# Patient Record
Sex: Male | Born: 1995 | Race: Black or African American | Hispanic: No | Marital: Single | Smoking: Current some day smoker
Health system: Southern US, Community
[De-identification: ages and names within clinical notes are randomized; demographics above are authoritative.]

---
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
## 2014-03-23 ENCOUNTER — Encounter (HOSPITAL_COMMUNITY): Payer: Self-pay | Admitting: Emergency Medicine

## 2014-03-23 ENCOUNTER — Emergency Department (HOSPITAL_COMMUNITY)
Admission: EM | Admit: 2014-03-23 | Discharge: 2014-03-23 | Disposition: A | Discharge status: Home / Self Care | Payer: Medicaid Other | Attending: Emergency Medicine | Admitting: Emergency Medicine

## 2014-03-23 DIAGNOSIS — Z791 Long term (current) use of non-steroidal anti-inflammatories (NSAID): Secondary | ICD-10-CM | POA: Insufficient documentation

## 2014-03-23 DIAGNOSIS — J029 Acute pharyngitis, unspecified: Secondary | ICD-10-CM

## 2014-03-23 DIAGNOSIS — Z79899 Other long term (current) drug therapy: Secondary | ICD-10-CM | POA: Insufficient documentation

## 2014-03-23 DIAGNOSIS — Z87891 Personal history of nicotine dependence: Secondary | ICD-10-CM | POA: Insufficient documentation

## 2014-03-23 MED ORDER — HYDROCODONE-ACETAMINOPHEN 5-325 MG PO TABS: 1.0000 | ORAL_TABLET | ORAL | Status: DC | PRN

## 2014-03-23 MED ORDER — IBUPROFEN 800 MG PO TABS: 800.0000 mg | ORAL_TABLET | Freq: Three times a day (TID) | ORAL | Status: DC

## 2014-03-23 MED ORDER — KETOROLAC TROMETHAMINE 10 MG PO TABS
10.0000 mg | ORAL_TABLET | Freq: Once | ORAL | Status: AC
  Administered 2014-03-23: 10 mg via ORAL
  Filled 2014-03-23: qty 1

## 2014-03-23 MED ORDER — PENICILLIN G BENZATHINE 1200000 UNIT/2ML IM SUSP
1.2000 10*6.[IU] | Freq: Once | INTRAMUSCULAR | Status: AC
  Administered 2014-03-23: 1.2 10*6.[IU] via INTRAMUSCULAR
  Filled 2014-03-23: qty 2

## 2014-03-23 NOTE — Discharge Instructions (Signed)
You were treated in the emergency department with an injection of Bicillin. Please use ibuprofen 3 times daily with food over the next 5 days. May use Norco for headache or more severe aching if needed. This medication may cause drowsiness, please do not drink alcohol, drive, operate machines, orbits is patent activities that require concentration while taking this medication. Pharyngitis Pharyngitis is a sore throat (pharynx). There is redness, pain, and swelling of your throat. HOME CARE   Drink enough fluids to keep your pee (urine) clear or pale yellow.  Only take medicine as told by your doctor.  You may get sick again if you do not take medicine as told. Finish your medicines, even if you start to feel better.  Do not take aspirin.  Rest.  Rinse your mouth (gargle) with salt water ( tsp of salt per 1 qt of water) every 1-2 hours. This will help the pain.  If you are not at risk for choking, you can suck on hard candy or sore throat lozenges. GET HELP IF:  You have large, tender lumps on your neck.  You have a rash.  You cough up green, yellow-brown, or bloody spit. GET HELP RIGHT AWAY IF:   You have a stiff neck.  You drool or cannot swallow liquids.  You throw up (vomit) or are not able to keep medicine or liquids down.  You have very bad pain that does not go away with medicine.  You have problems breathing (not from a stuffy nose). MAKE SURE YOU:   Understand these instructions.  Will watch your condition.  Will get help right away if you are not doing well or get worse. Document Released: 07/01/2007 Document Revised: 11/02/2012 Document Reviewed: 09/19/2012 [facility] Patient Information 2015 [employer], [place]. This information is not intended to replace advice given to you by your health care provider. Make sure you discuss any questions you have with your health care provider.

## 2014-03-23 NOTE — ED Notes (Signed)
Sore throat, with exudate on tonsils.  Alert, mouth dry.  NAD

## 2014-03-23 NOTE — ED Notes (Signed)
Pt reports sore throat x 2 days

## 2014-03-23 NOTE — ED Provider Notes (Signed)
CSN: [id number]     Arrival date & time 03/23/14  1106 History   First MD Initiated Contact with Patient 03/23/14 1140     Chief Complaint  Patient presents with  . Sore Throat     (Consider location/radiation/quality/duration/timing/severity/associated sxs/prior Treatment) Patient is a 19 y.o. male presenting with pharyngitis. The history is provided by the patient.  Sore Throat This is a new problem. The current episode started in the past 7 days. The problem occurs intermittently. The problem has been gradually worsening. Associated symptoms include chills and a sore throat. Pertinent negatives include no abdominal pain, congestion, coughing, nausea, rash or vomiting. The symptoms are aggravated by swallowing. He has tried nothing for the symptoms. The treatment provided no relief.    History reviewed. No pertinent past medical history. History reviewed. No pertinent past surgical history. History reviewed. No pertinent family history. History  Substance Use Topics  . Smoking status: Former Games developermoker  . Smokeless tobacco: Not on file  . Alcohol Use: No    Review of Systems  Constitutional: Positive for chills.  HENT: Positive for sore throat. Negative for congestion.   Respiratory: Negative for cough.   Gastrointestinal: Negative for nausea, vomiting and abdominal pain.  Skin: Negative for rash.  All other systems reviewed and are negative.     Allergies  Review of patient's allergies indicates no known allergies.  Home Medications   Prior to Admission medications   Medication Sig Start Date End Date Taking? Authorizing Provider  cyclobenzaprine (FLEXERIL) 10 MG tablet Take 1 tablet (10 mg total) by mouth 2 (two) times daily as needed for muscle spasms. 03/22/12   Glynn OctaveStephen Rancour, MD  HYDROcodone-acetaminophen (NORCO/VICODIN) 5-325 MG per tablet Take 1 tablet by mouth every 4 (four) hours as needed. 04/24/12   Hope Orlene OchM Neese, NP  ibuprofen (ADVIL,MOTRIN) 800 MG tablet Take  800 mg by mouth 3 (three) times daily. 03/22/12   Glynn OctaveStephen Rancour, MD   BP 125/62 mmHg  Pulse 78  Temp(Src) 98.1 F (36.7 C) (Oral)  Resp 18  Ht 5\' 10"  (1.778 m)  Wt 209 lb (94.802 kg)  BMI 29.99 kg/m2  SpO2 100% Physical Exam  Constitutional: He is oriented to person, place, and time. He appears well-developed and well-nourished.  Non-toxic appearance.  HENT:  Head: Normocephalic.  Right Ear: Tympanic membrane and external ear normal.  Left Ear: Tympanic membrane and external ear normal.  Mouth/Throat: Uvula swelling present. Posterior oropharyngeal erythema and tonsillar abscesses present.  Eyes: EOM and lids are normal. Pupils are equal, round, and reactive to light.  Neck: Normal range of motion. Neck supple. Carotid bruit is not present. No tracheal deviation present.  Mild cervical lymphadenopathy.  Cardiovascular: Normal rate, regular rhythm, normal heart sounds, intact distal pulses and normal pulses.   Pulmonary/Chest: Breath sounds normal. No respiratory distress.  Abdominal: Soft. Bowel sounds are normal. There is no tenderness. There is no guarding.  Musculoskeletal: Normal range of motion.  Lymphadenopathy:       Head (right side): No submandibular adenopathy present.       Head (left side): No submandibular adenopathy present.    He has no cervical adenopathy.  Neurological: He is alert and oriented to person, place, and time. He has normal strength. No cranial nerve deficit or sensory deficit.  Skin: Skin is warm and dry.  Psychiatric: He has a normal mood and affect. His speech is normal.  Nursing note and vitals reviewed.   ED Course  Procedures (including critical care time)  Labs Review Labs Reviewed - No data to display  Imaging Review No results found.   EKG Interpretation None      MDM  Patient treated in the emergency department with Bicillin. Prescription given for ibuprofen 800 mg and Norco 5 mg. Patient advised to increase fluids, wash hands  frequently, and use a mask until symptoms have resolved. Work excuse given for the patient to return on March 2.    Final diagnoses:  None    **I have reviewed nursing notes, vital signs, and all appropriate lab and imaging results for this patient.Kathie Dike, PA-C 03/23/14 1153  Kristen N Ward, DO 03/23/14 1506

## 2015-07-13 ENCOUNTER — Emergency Department (HOSPITAL_COMMUNITY)
Admission: EM | Admit: 2015-07-13 | Discharge: 2015-07-13 | Disposition: A | Discharge status: Home / Self Care | Payer: Self-pay | Attending: Emergency Medicine | Admitting: Emergency Medicine

## 2015-07-13 ENCOUNTER — Emergency Department (HOSPITAL_COMMUNITY): Payer: Self-pay

## 2015-07-13 ENCOUNTER — Encounter (HOSPITAL_COMMUNITY): Payer: Self-pay

## 2015-07-13 DIAGNOSIS — Z87891 Personal history of nicotine dependence: Secondary | ICD-10-CM | POA: Insufficient documentation

## 2015-07-13 DIAGNOSIS — Y999 Unspecified external cause status: Secondary | ICD-10-CM | POA: Insufficient documentation

## 2015-07-13 DIAGNOSIS — Y929 Unspecified place or not applicable: Secondary | ICD-10-CM | POA: Insufficient documentation

## 2015-07-13 DIAGNOSIS — Y939 Activity, unspecified: Secondary | ICD-10-CM | POA: Insufficient documentation

## 2015-07-13 DIAGNOSIS — W208XXA Other cause of strike by thrown, projected or falling object, initial encounter: Secondary | ICD-10-CM | POA: Insufficient documentation

## 2015-07-13 DIAGNOSIS — S61411A Laceration without foreign body of right hand, initial encounter: Secondary | ICD-10-CM | POA: Insufficient documentation

## 2015-07-13 MED ORDER — HYDROCODONE-ACETAMINOPHEN 5-325 MG PO TABS
1.0000 | ORAL_TABLET | Freq: Once | ORAL | Status: AC
  Administered 2015-07-13: 1 via ORAL
  Filled 2015-07-13: qty 1

## 2015-07-13 MED ORDER — ONDANSETRON 4 MG PO TBDP
4.0000 mg | ORAL_TABLET | Freq: Once | ORAL | Status: AC
Start: 2015-07-13 — End: 2015-07-13
  Administered 2015-07-13: 4 mg via ORAL
  Filled 2015-07-13: qty 1

## 2015-07-13 MED ORDER — TETANUS-DIPHTH-ACELL PERTUSSIS 5-2.5-18.5 LF-MCG/0.5 IM SUSP
0.5000 mL | Freq: Once | INTRAMUSCULAR | Status: AC
  Administered 2015-07-13: 0.5 mL via INTRAMUSCULAR
  Filled 2015-07-13: qty 0.5

## 2015-07-13 MED ORDER — LIDOCAINE HCL (PF) 1 % IJ SOLN
5.0000 mL | Freq: Once | INTRAMUSCULAR | Status: AC
  Administered 2015-07-13: 5 mL
  Filled 2015-07-13: qty 5

## 2015-07-13 MED ORDER — CEPHALEXIN 500 MG PO CAPS: 500.0000 mg | ORAL_CAPSULE | Freq: Three times a day (TID) | ORAL | Status: DC

## 2015-07-13 NOTE — ED Notes (Signed)
Pt lightheaded after procedure. Coke offered and pt now sipping beverage with family near by

## 2015-07-13 NOTE — ED Notes (Signed)
Patient c/o multiple lacerations to right hand. Per patient large mirror broke over top of his head and he put hand up to guard self. Patient unsure of last tetanus vaccination. Patient has dressing with ace wrap to hand.

## 2015-07-13 NOTE — Discharge Instructions (Signed)
There may be a tiny piece of glass in the wound that I was not able to find. If it is you may see it surface under the skin later.   Return in 2 days for wound check. Return sooner for any problems.   Sutured Wound Care Sutures are stitches that can be used to close wounds. Taking care of your wound properly can help to prevent pain and infection. It can also help your wound to heal more quickly. HOW TO CARE FOR YOUR SUTURED WOUND Wound Care  Keep the wound clean and dry.  If you were given a bandage (dressing), you should change it at least once per day or as directed by your health care provider. You should also change it if it becomes wet or dirty.  Keep the wound completely dry for the first 24 hours or as directed by your health care provider. After that time, you may shower or bathe. However, make sure that the wound is not soaked in water until the sutures have been removed.  Clean the wound one time each day or as directed by your health care provider.  Wash the wound with soap and water.  Rinse the wound with water to remove all soap.  Pat the wound dry with a clean towel. Do not rub the wound.  Aftercleaning the wound, apply a thin layer of antibioticointment as directed by your health care provider. This will help to prevent infection and keep the dressing from sticking to the wound.  Have the sutures removed as directed by your health care provider. General Instructions  Take or apply medicines only as directed by your health care provider.  To help prevent scarring, make sure to cover your wound with sunscreen whenever you are outside after the sutures are removed and the wound is healed. Make sure to wear a sunscreen of at least 30 SPF.  If you were prescribed an antibiotic medicine or ointment, finish all of it even if you start to feel better.  Do not scratch or pick at the wound.  Keep all follow-up visits as directed by your health care provider. This is  important.  Check your wound every day for signs of infection. Watch for:   Redness, swelling, or pain.  Fluid, blood, or pus.  Raise (elevate) the injured area above the level of your heart while you are sitting or lying down, if possible.  Avoid stretching your wound.  Drink enough fluids to keep your urine clear or pale yellow. SEEK MEDICAL CARE IF:  You received a tetanus shot and you have swelling, severe pain, redness, or bleeding at the injection site.  You have a fever.  A wound that was closed breaks open.  You notice a bad smell coming from the wound.  You notice something coming out of the wound, such as wood or glass.  Your pain is not controlled with medicine.  You have increased redness, swelling, or pain at the site of your wound.  You have fluid, blood, or pus coming from your wound.  You notice a change in the color of your skin near your wound.  You need to change the dressing frequently due to fluid, blood, or pus draining from the wound.  You develop a new rash.  You develop numbness around the wound. SEEK IMMEDIATE MEDICAL CARE IF:  You develop severe swelling around the injury site.  Your pain suddenly increases and is severe.  You develop painful lumps near the wound or on  skin that is anywhere on your body.  You have a red streak going away from your wound.  The wound is on your hand or foot and you cannot properly move a finger or toe.  The wound is on your hand or foot and you notice that your fingers or toes look pale or bluish.   This information is not intended to replace advice given to you by your health care provider. Make sure you discuss any questions you have with your health care provider.   Document Released: 02/20/2004 Document Revised: 05/29/2014 Document Reviewed: 08/24/2012 Elsevier Interactive Patient Education [employer]

## 2015-07-13 NOTE — ED Provider Notes (Signed)
CSN: [id number]     Arrival date & time 07/13/15  1842 History   First MD Initiated Contact with Patient 07/13/15 1857     Chief Complaint  Patient presents with  . Laceration     (Consider location/radiation/quality/duration/timing/severity/associated sxs/prior Treatment) HPI Daniel Fischer is a 20 y.o. male who presents to the ED with lacerations to the right hand. Patient reports that a large mirror was falling off the wall and he put his hand up to protect his head from the mirror and when it broke it caused several cuts to his hand.  Patient denies any other injuries.   History reviewed. No pertinent past medical history. History reviewed. No pertinent past surgical history. No family history on file. Social History  Substance Use Topics  . Smoking status: Former Games developermoker  . Smokeless tobacco: None  . Alcohol Use: Yes     Comment: occ    Review of Systems Negative except as stated in HPI   Allergies  Review of patient's allergies indicates no known allergies.  Home Medications   Prior to Admission medications   Medication Sig Start Date End Date Taking? Authorizing Provider  cephALEXin (KEFLEX) 500 MG capsule Take 1 capsule (500 mg total) by mouth 3 (three) times daily. 07/13/15   Hope Orlene OchM Neese, NP   BP 144/75 mmHg  Pulse 66  Temp(Src) 99 F (37.2 C) (Oral)  Resp 18  Wt 94.802 kg  SpO2 96% Physical Exam  Constitutional: He is oriented to person, place, and time. He appears well-developed and well-nourished.  HENT:  Head: Normocephalic and atraumatic.  Eyes: EOM are normal.  Neck: Neck supple.  Cardiovascular: Normal rate.   Pulmonary/Chest: Effort normal.  Musculoskeletal: Normal range of motion.       Right hand: He exhibits tenderness and laceration. He exhibits normal range of motion and normal capillary refill. Normal sensation noted. Normal strength noted. He exhibits no thumb/finger opposition.  Three are multiple lacerations to the dorsum of the right  hand. There is one that measures 3 cm and is gaping and 3 others that are superficial and and smaller.   Neurological: He is alert and oriented to person, place, and time. No cranial nerve deficit.  Skin: Skin is warm and dry.  Psychiatric: He has a normal mood and affect. His behavior is normal.  Nursing note and vitals reviewed.  The 3 cm wound to the dorsum of the right hand was closed with sutures, see laceration repair note.   There are 3 additional superficial lacerations to the dorsum of the hand. These wounds were cleaned and closed with steri strips and dermabond. The first superficial laceration is 1 cm, the second is 0.5 cm and the third is 1 cm.   ED Course  .Marland Kitchen.Laceration Repair Date/Time: 07/13/2015 9:16 PM Performed by: Janne NapoleonNEESE, HOPE M Authorized by: Janne NapoleonNEESE, HOPE M Consent: Verbal consent obtained. Risks and benefits: risks, benefits and alternatives were discussed Consent given by: patient Patient understanding: patient states understanding of the procedure being performed Required items: required blood products, implants, devices, and special equipment available Patient identity confirmed: verbally with patient Body area: upper extremity Location details: right hand Laceration length: 3 cm Contamination: The wound is contaminated. Foreign bodies: glass Tendon involvement: none Nerve involvement: none Vascular damage: no Anesthesia: local infiltration Local anesthetic: lidocaine 1% without epinephrine Patient sedated: no Preparation: Patient was prepped and draped in the usual sterile fashion. Irrigation solution: saline Irrigation method: syringe Amount of cleaning: standard Debridement: none Degree of undermining:  none Skin closure: 4-0 Prolene Number of sutures: 5 Technique: simple Approximation: loose Approximation difficulty: simple Dressing: pressure dressing Patient tolerance: Patient tolerated the procedure well with no immediate complications Comments:  Tetanus updated I explored the wound and did not find glass although it was noted on x=ray.  Glass precautions given to the patient.     (including critical care time) Labs Review Labs Reviewed - No data to display  Imaging Review Dg Hand Complete Right  07/13/2015  CLINICAL DATA:  Rt hand laceration to distal 2nd and 3rd metacarpal. Pt stated a mirror fell on his hand. Prior history of rt wrist dislocation. EXAM: RIGHT HAND - COMPLETE 3+ VIEW COMPARISON:  05/09/2008 FINDINGS: No fracture. No bone lesion. Joints are normally spaced and aligned. On the lateral view, a subtle small radiopaque foreign body is evident which may reside within or just underneath the skin or on the skin surface. No other evidence of a radiopaque foreign body. IMPRESSION: Foreign body project over the dorsal aspect of the he and dorsal to the third metacarpal head. No other evidence of a foreign body. No fracture or dislocation. Electronically Signed   By: Amie Portland M.D.   On: 07/13/2015 19:42   I have personally reviewed and evaluated these images and lab results as part of my medical decision-making.   MDM  20 y.o. male with lacerations to the dorsum of the right hand s/p injury stable for d/c without focal neuro deficits. Will treat with antibiotics and have patient return in 2 days for wound check or sooner for any problems. Glass precautions given.   Final diagnoses:  Laceration of right hand, initial encounter        [place], NP 07/13/15 2126  Mancel Bale, MD 07/13/15 2303

## 2015-07-13 NOTE — ED Notes (Signed)
PA in to repair wound

## 2015-07-15 ENCOUNTER — Encounter (HOSPITAL_COMMUNITY): Payer: Self-pay | Admitting: Emergency Medicine

## 2015-07-15 ENCOUNTER — Emergency Department (HOSPITAL_COMMUNITY)
Admission: EM | Admit: 2015-07-15 | Discharge: 2015-07-15 | Disposition: A | Discharge status: Home / Self Care | Payer: Self-pay | Attending: Emergency Medicine | Admitting: Emergency Medicine

## 2015-07-15 DIAGNOSIS — S61411D Laceration without foreign body of right hand, subsequent encounter: Secondary | ICD-10-CM | POA: Insufficient documentation

## 2015-07-15 DIAGNOSIS — Y999 Unspecified external cause status: Secondary | ICD-10-CM | POA: Insufficient documentation

## 2015-07-15 DIAGNOSIS — W269XXA Contact with unspecified sharp object(s), initial encounter: Secondary | ICD-10-CM | POA: Insufficient documentation

## 2015-07-15 DIAGNOSIS — Y929 Unspecified place or not applicable: Secondary | ICD-10-CM | POA: Insufficient documentation

## 2015-07-15 DIAGNOSIS — Z5189 Encounter for other specified aftercare: Secondary | ICD-10-CM

## 2015-07-15 DIAGNOSIS — Y939 Activity, unspecified: Secondary | ICD-10-CM | POA: Insufficient documentation

## 2015-07-15 DIAGNOSIS — Z87891 Personal history of nicotine dependence: Secondary | ICD-10-CM | POA: Insufficient documentation

## 2015-07-15 NOTE — Discharge Instructions (Signed)

## 2015-07-15 NOTE — ED Provider Notes (Signed)
CSN: [id number]     Arrival date & time 07/15/15  1939 History   First MD Initiated Contact with Patient 07/15/15 2053     Chief Complaint  Patient presents with  . Wound Check     (Consider location/radiation/quality/duration/timing/severity/associated sxs/prior Treatment) Patient is a 20 y.o. male presenting with wound check. The history is provided by the patient. No language interpreter was used.  Wound Check This is a recurrent problem. The current episode started in the past 7 days. The problem occurs constantly. The problem has been unchanged. Nothing aggravates the symptoms. The treatment provided significant relief.  Pt here for recheck of a laceration.  Pt reports no complaints.    History reviewed. No pertinent past medical history. History reviewed. No pertinent past surgical history. History reviewed. No pertinent family history. Social History  Substance Use Topics  . Smoking status: Former Games developermoker  . Smokeless tobacco: None  . Alcohol Use: Yes     Comment: occ    Review of Systems  All other systems reviewed and are negative.     Allergies  Review of patient's allergies indicates no known allergies.  Home Medications   Prior to Admission medications   Medication Sig Start Date End Date Taking? Authorizing Provider  cephALEXin (KEFLEX) 500 MG capsule Take 1 capsule (500 mg total) by mouth 3 (three) times daily. 07/13/15   Hope Orlene OchM Neese, NP   BP 132/78 mmHg  Pulse 84  Temp(Src) 99 F (37.2 C) (Oral)  Resp 18  Ht 5\' 10"  (1.778 m)  Wt 94.802 kg  BMI 29.99 kg/m2  SpO2 100% Physical Exam  Constitutional: He appears well-developed and well-nourished.  Musculoskeletal:  Sutures in place, no redness no sign of infection  Neurological: He is alert.  Skin: Skin is warm.  Psychiatric: He has a normal mood and affect.  Nursing note and vitals reviewed.   ED Course  Procedures (including critical care time) Labs Review Labs Reviewed - No data to  display  Imaging Review No results found. I have personally reviewed and evaluated these images and lab results as part of my medical decision-making.   EKG Interpretation None      MDM   Final diagnoses:  None    Return in 6 days for suture removal  Elson AreasLeslie K Raenette Sakata, PA-C 07/15/15 2105  Glynn OctaveStephen Rancour, MD 07/15/15 [phone]

## 2015-07-15 NOTE — ED Notes (Signed)
Patient states he cut his right hand on Saturday and had stitches same day. States "they wanted me to come back today and get a recheck on it."

## 2015-10-25 ENCOUNTER — Emergency Department (HOSPITAL_COMMUNITY): Payer: No Typology Code available for payment source

## 2015-10-25 ENCOUNTER — Emergency Department (HOSPITAL_COMMUNITY)
Admission: EM | Admit: 2015-10-25 | Discharge: 2015-10-26 | Disposition: A | Discharge status: Home / Self Care | Payer: No Typology Code available for payment source | Attending: Emergency Medicine | Admitting: Emergency Medicine

## 2015-10-25 ENCOUNTER — Encounter (HOSPITAL_COMMUNITY): Payer: Self-pay | Admitting: Emergency Medicine

## 2015-10-25 DIAGNOSIS — Y939 Activity, unspecified: Secondary | ICD-10-CM | POA: Insufficient documentation

## 2015-10-25 DIAGNOSIS — Y999 Unspecified external cause status: Secondary | ICD-10-CM | POA: Insufficient documentation

## 2015-10-25 DIAGNOSIS — S0083XA Contusion of other part of head, initial encounter: Secondary | ICD-10-CM | POA: Diagnosis not present

## 2015-10-25 DIAGNOSIS — Y92410 Unspecified street and highway as the place of occurrence of the external cause: Secondary | ICD-10-CM | POA: Insufficient documentation

## 2015-10-25 DIAGNOSIS — R918 Other nonspecific abnormal finding of lung field: Secondary | ICD-10-CM | POA: Insufficient documentation

## 2015-10-25 DIAGNOSIS — S0993XA Unspecified injury of face, initial encounter: Secondary | ICD-10-CM | POA: Diagnosis present

## 2015-10-25 DIAGNOSIS — M25559 Pain in unspecified hip: Secondary | ICD-10-CM

## 2015-10-25 DIAGNOSIS — M25552 Pain in left hip: Secondary | ICD-10-CM | POA: Diagnosis not present

## 2015-10-25 LAB — I-STAT CHEM 8, ED
BUN: 13 mg/dL (ref 6–20)
CHLORIDE: 105 mmol/L (ref 101–111)
CREATININE: 1.4 mg/dL — AB (ref 0.61–1.24)
Calcium, Ion: 1.07 mmol/L — ABNORMAL LOW (ref 1.15–1.40)
GLUCOSE: 95 mg/dL (ref 65–99)
HCT: 43 % (ref 39.0–52.0)
Hemoglobin: 14.6 g/dL (ref 13.0–17.0)
POTASSIUM: 3.3 mmol/L — AB (ref 3.5–5.1)
Sodium: 142 mmol/L (ref 135–145)
TCO2: 22 mmol/L (ref 0–100)

## 2015-10-25 LAB — COMPREHENSIVE METABOLIC PANEL
ALK PHOS: 83 U/L (ref 38–126)
ALT: 23 U/L (ref 17–63)
AST: 24 U/L (ref 15–41)
Albumin: 4.9 g/dL (ref 3.5–5.0)
Anion gap: 10 (ref 5–15)
BILIRUBIN TOTAL: 0.9 mg/dL (ref 0.3–1.2)
BUN: 12 mg/dL (ref 6–20)
CO2: 23 mmol/L (ref 22–32)
Calcium: 9.8 mg/dL (ref 8.9–10.3)
Chloride: 107 mmol/L (ref 101–111)
Creatinine, Ser: 1.31 mg/dL — ABNORMAL HIGH (ref 0.61–1.24)
GFR calc non Af Amer: >60 mL/min (ref >60)
Glucose, Bld: 96 mg/dL (ref 65–99)
Potassium: 3.3 mmol/L — ABNORMAL LOW (ref 3.5–5.1)
SODIUM: 140 mmol/L (ref 135–145)
TOTAL PROTEIN: 7.7 g/dL (ref 6.5–8.1)

## 2015-10-25 LAB — CBC
HEMATOCRIT: 40.7 % (ref 39.0–52.0)
HEMOGLOBIN: 13 g/dL (ref 13.0–17.0)
MCH: 21.3 pg — ABNORMAL LOW (ref 26.0–34.0)
MCHC: 31.9 g/dL (ref 30.0–36.0)
MCV: 66.6 fL — AB (ref 78.0–100.0)
Platelets: 263 10*3/uL (ref 150–400)
RBC: 6.11 MIL/uL — ABNORMAL HIGH (ref 4.22–5.81)
RDW: 15.3 % (ref 11.5–15.5)
WBC: 7.4 10*3/uL (ref 4.0–10.5)

## 2015-10-25 LAB — I-STAT CG4 LACTIC ACID, ED: LACTIC ACID, VENOUS: 1.6 mmol/L (ref 0.5–1.9)

## 2015-10-25 LAB — ETHANOL: Alcohol, Ethyl (B): <5 mg/dL (ref <5)

## 2015-10-25 LAB — PROTIME-INR
INR: 1
PROTHROMBIN TIME: 13.2 s (ref 11.4–15.2)

## 2015-10-25 LAB — SAMPLE TO BLOOD BANK

## 2015-10-25 MED ORDER — HYDROMORPHONE HCL 1 MG/ML IJ SOLN
1.0000 mg | Freq: Once | INTRAMUSCULAR | Status: AC
  Administered 2015-10-25: 1 mg via INTRAVENOUS

## 2015-10-25 MED ORDER — IOPAMIDOL (ISOVUE-300) INJECTION 61%
INTRAVENOUS | Status: AC
  Administered 2015-10-25: 100 mL
  Filled 2015-10-25: qty 100

## 2015-10-25 MED ORDER — SODIUM CHLORIDE 0.9 % IV SOLN
INTRAVENOUS | Status: AC | PRN
  Administered 2015-10-25: 1000 mL via INTRAVENOUS

## 2015-10-25 MED ORDER — HYDROMORPHONE HCL 1 MG/ML IJ SOLN
INTRAMUSCULAR | Status: AC
  Administered 2015-10-25: 1 mg via INTRAVENOUS
  Filled 2015-10-25: qty 1
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
## 2015-10-25 NOTE — ED Notes (Signed)
Pt asked RN to call mother Maricela Boasha Czajkowski; Mrs.Teehan was informed of pt's status per pt's request
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
## 2015-10-25 NOTE — ED Notes (Signed)
Patient transported to CT 
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
## 2015-10-26 ENCOUNTER — Encounter (HOSPITAL_COMMUNITY): Payer: Self-pay | Admitting: Emergency Medicine

## 2015-10-26 MED ORDER — HYDROMORPHONE HCL 1 MG/ML IJ SOLN
1.0000 mg | Freq: Once | INTRAMUSCULAR | Status: AC
  Administered 2015-10-26: 1 mg via INTRAVENOUS
  Filled 2015-10-26: qty 1

## 2015-10-26 MED ORDER — KETOROLAC TROMETHAMINE 30 MG/ML IJ SOLN
30.0000 mg | Freq: Once | INTRAMUSCULAR | Status: AC
  Administered 2015-10-26: 30 mg via INTRAVENOUS
  Filled 2015-10-26: qty 1

## 2015-10-26 MED ORDER — HYDROCODONE-ACETAMINOPHEN 5-325 MG PO TABS: 1.0000 | ORAL_TABLET | ORAL | 0 refills | Status: DC | PRN

## 2015-10-26 NOTE — ED Notes (Signed)
Attempted to ambulate pt. Pt states that he "can't move my leg still" when asked to move left leg. With help of family, pt was able to sit on the side of the bed but became very dizzy and diaphoretic. Pt was told to lay back down in bed.
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
## 2016-05-08 ENCOUNTER — Emergency Department (HOSPITAL_COMMUNITY)
Admission: EM | Admit: 2016-05-08 | Discharge: 2016-05-08 | Disposition: A | Discharge status: Home / Self Care | Payer: Self-pay | Attending: Emergency Medicine | Admitting: Emergency Medicine

## 2016-05-08 ENCOUNTER — Encounter (HOSPITAL_COMMUNITY): Payer: Self-pay | Admitting: Emergency Medicine

## 2016-05-08 DIAGNOSIS — Y990 Civilian activity done for income or pay: Secondary | ICD-10-CM | POA: Insufficient documentation

## 2016-05-08 DIAGNOSIS — S39012A Strain of muscle, fascia and tendon of lower back, initial encounter: Secondary | ICD-10-CM | POA: Insufficient documentation

## 2016-05-08 DIAGNOSIS — X58XXXA Exposure to other specified factors, initial encounter: Secondary | ICD-10-CM | POA: Insufficient documentation

## 2016-05-08 DIAGNOSIS — Y939 Activity, unspecified: Secondary | ICD-10-CM | POA: Insufficient documentation

## 2016-05-08 DIAGNOSIS — M62838 Other muscle spasm: Secondary | ICD-10-CM

## 2016-05-08 DIAGNOSIS — F17290 Nicotine dependence, other tobacco product, uncomplicated: Secondary | ICD-10-CM | POA: Insufficient documentation

## 2016-05-08 DIAGNOSIS — Y929 Unspecified place or not applicable: Secondary | ICD-10-CM | POA: Insufficient documentation

## 2016-05-08 MED ORDER — HYDROMORPHONE HCL 1 MG/ML IJ SOLN
1.0000 mg | Freq: Once | INTRAMUSCULAR | Status: AC
  Administered 2016-05-08: 1 mg via INTRAMUSCULAR
  Filled 2016-05-08: qty 1

## 2016-05-08 MED ORDER — PREDNISONE 10 MG PO TABS: ORAL_TABLET | ORAL | 0 refills | Status: DC

## 2016-05-08 MED ORDER — METHOCARBAMOL 500 MG PO TABS
1000.0000 mg | ORAL_TABLET | Freq: Once | ORAL | Status: AC
  Administered 2016-05-08: 1000 mg via ORAL
  Filled 2016-05-08: qty 2

## 2016-05-08 MED ORDER — METHOCARBAMOL 500 MG PO TABS: 1000.0000 mg | ORAL_TABLET | Freq: Four times a day (QID) | ORAL | 0 refills | Status: AC

## 2016-05-08 MED ORDER — HYDROCODONE-ACETAMINOPHEN 5-325 MG PO TABS: 1.0000 | ORAL_TABLET | ORAL | 0 refills | Status: DC | PRN

## 2016-05-08 MED ORDER — PREDNISONE 50 MG PO TABS
60.0000 mg | ORAL_TABLET | Freq: Once | ORAL | Status: AC
  Administered 2016-05-08: 60 mg via ORAL
  Filled 2016-05-08: qty 1

## 2016-05-08 NOTE — Discharge Instructions (Signed)
Take your next dose of prednisone tomorrow morning.  Use the the other medicines as directed.  Do not drive within 4 hours of taking hydrocodone as this will make you drowsy.  Avoid lifting,  Bending,  Twisting or any other activity that worsens your pain over the next week.  Apply a heating pad to your back for 20 minutes several times daily which can help relax your muscles..  You should get rechecked if your symptoms are not better over the next 5 days,  Or you develop increased pain,  Weakness in your leg(s) or loss of bladder or bowel function - these are symptoms of a worse injury.

## 2016-05-08 NOTE — ED Triage Notes (Signed)
Pt reports with back since Tues, increases with movement, shoots from mid lower back up spine, a/o, no acute distress noted at this time
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
## 2019-04-27 ENCOUNTER — Ambulatory Visit: Payer: Self-pay | Attending: Internal Medicine

## 2019-04-27 DIAGNOSIS — Z23 Encounter for immunization: Secondary | ICD-10-CM

## 2019-04-27 NOTE — Progress Notes (Signed)
   Covid-19 Vaccination Clinic  Name:  Janari Yamada    MRN: [id number] DOB: 13-Jul-1995  04/27/2019  Mr. Lebeda was observed post Covid-19 immunization for 15 minutes without incident. He was provided with Vaccine Information Sheet and instruction to access the V-Safe system.   Mr. Ballengee was instructed to call 911 with any severe reactions post vaccine: Marland Kitchen Difficulty breathing  . Swelling of face and throat  . A fast heartbeat  . A bad rash all over body  . Dizziness and weakness   Immunizations Administered    Name Date Dose VIS Date Route   Moderna COVID-19 Vaccine 04/27/2019  8:36 AM 0.5 mL 12/27/2018 Intramuscular   Manufacturer: Moderna   Lot: [id number]   NDC: 30149-969-24

## 2019-05-30 ENCOUNTER — Ambulatory Visit: Payer: Self-pay

## 2019-05-31 ENCOUNTER — Ambulatory Visit: Payer: Self-pay

## 2019-06-01 ENCOUNTER — Ambulatory Visit: Payer: Self-pay | Attending: Internal Medicine

## 2019-06-01 DIAGNOSIS — Z23 Encounter for immunization: Secondary | ICD-10-CM

## 2019-06-01 NOTE — Progress Notes (Signed)
   Covid-19 Vaccination Clinic  Name:  Blayke Cordrey    MRN: [id number] DOB: May 21, 1995  06/01/2019  Mr. Fredericksen was observed post Covid-19 immunization for 15 minutes without incident. He was provided with Vaccine Information Sheet and instruction to access the V-Safe system.   Mr. Bihm was instructed to call 911 with any severe reactions post vaccine: Marland Kitchen Difficulty breathing  . Swelling of face and throat  . A fast heartbeat  . A bad rash all over body  . Dizziness and weakness   Immunizations Administered    Name Date Dose VIS Date Route   Moderna COVID-19 Vaccine 06/01/2019  8:41 AM 0.5 mL 12/2018 Intramuscular   Manufacturer: Moderna   Lot: [id number]   NDC: 12248-250-03

## 2022-05-08 ENCOUNTER — Encounter (HOSPITAL_COMMUNITY): Payer: Self-pay

## 2022-05-08 ENCOUNTER — Emergency Department (HOSPITAL_COMMUNITY)
Admission: EM | Admit: 2022-05-08 | Discharge: 2022-05-08 | Disposition: A | Discharge status: Home / Self Care | Payer: Self-pay | Attending: Emergency Medicine | Admitting: Emergency Medicine

## 2022-05-08 DIAGNOSIS — J020 Streptococcal pharyngitis: Secondary | ICD-10-CM | POA: Insufficient documentation

## 2022-05-08 LAB — GROUP A STREP BY PCR: Group A Strep by PCR: DETECTED — AB

## 2022-05-08 MED ORDER — DEXAMETHASONE 10 MG/ML FOR PEDIATRIC ORAL USE
8.0000 mg | Freq: Once | INTRAMUSCULAR | Status: AC
  Administered 2022-05-08: 8 mg via ORAL
  Filled 2022-05-08: qty 0.8

## 2022-05-08 MED ORDER — PENICILLIN V POTASSIUM 500 MG PO TABS: 500.0000 mg | ORAL_TABLET | Freq: Two times a day (BID) | ORAL | 0 refills | Status: DC

## 2022-05-08 MED ORDER — PENICILLIN V POTASSIUM 500 MG PO TABS: 500.0000 mg | ORAL_TABLET | Freq: Two times a day (BID) | ORAL | 0 refills | Status: AC

## 2022-05-08 NOTE — ED Provider Notes (Signed)
[place] EMERGENC[facility] DE[facility]MENT AT [facility] Provider Note   CSN: [id number] Arrival date & time: 05/08/22  0532     History  Chief Complaint  Patient presents with   Sore Throat    Daniel Fischer is a 27 y.o. male.  HPI   Patient without significant medical history coming in with complaints of a sore throat.  Patient states he has a history of getting tonsillitis in his throat, he states that his fiance recently tested positive for strep and he is concerned he might have it as well.  He denies any tongue throat lip swelling difficulty breathing, no strict fever chills cough congestion.  Not having any other complaints.  Home Medications Prior to Admission medications   Medication Sig Start Date End Date Taking? Authorizing Provider  cephALEXin (KEFLEX) 500 MG capsule Take 1 capsule (500 mg total) by mouth 3 (three) times daily. 07/13/15   Janne Napoleon, NP  HYDROcodone-acetaminophen (NORCO/VICODIN) 5-325 MG tablet Take 1 tablet by mouth every 4 (four) hours as needed. 05/08/16   Burgess Amor, PA-C  penicillin v potassium (VEETID) 500 MG tablet Take 1 tablet (500 mg total) by mouth 2 (two) times daily for 10 days. 05/08/22 05/18/22  Carroll Sage, PA-C  predniSONE (DELTASONE) 10 MG tablet Take 6 tablets day one, 5 tablets day two, 4 tablets day three, 3 tablets day four, 2 tablets day five, then 1 tablet day six 05/09/16   Burgess Amor, PA-C      Allergies    Patient has no known allergies.    Review of Systems   Review of Systems  Constitutional:  Negative for chills and fever.  HENT:  Positive for sore throat.   Respiratory:  Negative for shortness of breath.   Cardiovascular:  Negative for chest pain.  Gastrointestinal:  Negative for abdominal pain.  Neurological:  Negative for headaches.    Physical Exam Updated Vital Signs BP 131/77 (BP Location: Right Arm)   Pulse 94   Temp 99.2 F (37.3 C)   Resp 18   SpO2 96%  Physical Exam Vitals and nursing  note reviewed.  Constitutional:      General: He is not in acute distress.    Appearance: He is not ill-appearing.  HENT:     Head: Normocephalic and atraumatic.     Nose: No congestion.     Mouth/Throat:     Mouth: Mucous membranes are moist.     Pharynx: Oropharyngeal exudate present.     Comments: No trismus no torticollis no oral edema present, tongue uvula both midline, controlling oral secretions, tonsils are both equal symmetric bilaterally, does have exudate present bilaterally, there is no submandibular swelling no muffled tone voice. Eyes:     Conjunctiva/sclera: Conjunctivae normal.  Cardiovascular:     Rate and Rhythm: Normal rate.  Pulmonary:     Effort: Pulmonary effort is normal.  Skin:    General: Skin is warm and dry.  Neurological:     Mental Status: He is alert.  Psychiatric:        Mood and Affect: Mood normal.     ED Results / Procedures / Treatments   Labs (all labs ordered are listed, but only abnormal results are displayed) Labs Reviewed  GROUP A STREP BY PCR - Abnormal; Notable for the following components:      Result Value   Group A Strep by PCR DETECTED (*)    All other components within normal limits    EKG  None  Radiology No results found.  Procedures Procedures    Medications Ordered in ED Medications  dexamethasone (DECADRON) 10 MG/ML injection for Pediatric ORAL use 8 mg (has no administration in time range)    ED Course/ Medical Decision Making/ A&P                             Medical Decision Making  This patient presents to the ED for concern of sore throat, this involves an extensive number of treatment options, and is a complaint that carries with it a high risk of complications and morbidity.  The differential diagnosis includes Ludwig angina, retropharyngeal/peritonsillar abscess, anginal edema    Additional history obtained:  Additional history obtained from N/A External records from outside source obtained and  reviewed including ED notes   Co morbidities that complicate the patient evaluation  N/A  Social Determinants of Health:  No primary care provider    Lab Tests:  I Ordered, and personally interpreted labs.  The pertinent results include: Strep throat positive   Imaging Studies ordered:  I ordered imaging studies including N/A I independently visualized and interpreted imaging which showed N/A I agree with the radiologist interpretation   Cardiac Monitoring:  The patient was maintained on a cardiac monitor.  I personally viewed and interpreted the cardiac monitored which showed an underlying rhythm of: N/A   Medicines ordered and prescription drug management:  I ordered medication including Decadron I have reviewed the patients home medicines and have made adjustments as needed  Critical Interventions:  N/A   Reevaluation:  Presents with a sore throat, triage obtained strep testing which is positive, correlates with my physical exam, patient agreement discharge at this time.  Consultations Obtained:  N/A    Test Considered:  N/a    Rule out I have low suspicion for peritonsillar abscess, retropharyngeal abscess, or Ludwig angina as oropharynx was visualized tongue and uvula were both midline, there is no exudates, erythema or edema noted in the posterior pillars or on/ around tonsils.  Doubt lugwid angina no submandibular swelling.  Doubt angioedema there is no oral edema during my examination.    Dispostion and problem list  After consideration of the diagnostic results and the patients response to treatment, I feel that the patent would benefit from discharge.  Strep pharyngitis-given a dose of Decadron in the emergency department will discharge home on antibiotics follow-up with PCP for further evaluation.            Final Clinical Impression(s) / ED Diagnoses Final diagnoses:  Strep pharyngitis    Rx / DC Orders ED Discharge Orders           Ordered    penicillin v potassium (VEETID) 500 MG tablet  2 times daily,   Status:  Discontinued        05/08/22 0638    penicillin v potassium (VEETID) 500 MG tablet  2 times daily        05/08/22 1941              Carroll Sage, PA-C 05/08/22 7408    Dione Booze, MD 05/08/22 [phone]

## 2022-05-08 NOTE — Discharge Instructions (Signed)
You have strep pharyngitis started you on antibiotics please take as prescribed May use over-the-counter pain medication as needed.  Come back to the emergency department if you develop chest pain, shortness of breath, severe abdominal pain, uncontrolled nausea, vomiting, diarrhea.

## 2022-05-08 NOTE — ED Triage Notes (Signed)
Pt states that he has been having a sore throat for the past few days. Girlfriend has strep

## 2023-04-02 ENCOUNTER — Ambulatory Visit: Payer: Self-pay | Admitting: Professional Counselor

## 2023-06-15 ENCOUNTER — Ambulatory Visit: Admitting: Internal Medicine

## 2023-06-23 ENCOUNTER — Encounter: Payer: Self-pay | Admitting: Internal Medicine

## 2023-10-12 ENCOUNTER — Telehealth: Admitting: Physician Assistant

## 2023-10-12 DIAGNOSIS — J039 Acute tonsillitis, unspecified: Secondary | ICD-10-CM | POA: Diagnosis not present

## 2023-10-12 MED ORDER — AMOXICILLIN 500 MG PO CAPS: 500.0000 mg | ORAL_CAPSULE | Freq: Two times a day (BID) | ORAL | 0 refills | Status: AC

## 2023-10-12 NOTE — Patient Instructions (Signed)
 Daniel Fischer, thank you for joining Daniel Velma Lunger, PA-C for today's virtual visit.  While this provider is not your primary care provider (PCP), if your PCP is located in our provider database this encounter information will be shared with them immediately following your visit.   A [facility] MyChart account gives you access to today's visit and all your visits, tests, and labs performed at [facility]  click here if you don't have a [facility] MyChart account or go to [email or web address]  Consent: (Patient) Daniel Fischer provided verbal consent for this virtual visit at the beginning of the encounter.  Current Medications:  Current Outpatient Medications:    cephALEXin  (KEFLEX ) 500 MG capsule, Take 1 capsule (500 mg total) by mouth 3 (three) times daily., Disp: 21 capsule, Rfl: 0   HYDROcodone -acetaminophen  (NORCO/VICODIN) 5-325 MG tablet, Take 1 tablet by mouth every 4 (four) hours as needed., Disp: 15 tablet, Rfl: 0   predniSONE  (DELTASONE ) 10 MG tablet, Take 6 tablets day one, 5 tablets day two, 4 tablets day three, 3 tablets day four, 2 tablets day five, then 1 tablet day six, Disp: 21 tablet, Rfl: 0   Medications ordered in this encounter:  No orders of the defined types were placed in this encounter.    *If you need refills on other medications prior to your next appointment, please contact your pharmacy*  Follow-Up: Call back or seek an in-person evaluation if the symptoms worsen or if the condition fails to improve as anticipated.  [facility] [phone]  Other Instructions Strep Throat, Adult Strep throat is an infection in the throat that is caused by bacteria. It is common during the cold months of the year. It mostly affects children who are 81-26 years old. However, people of all ages can get it at any time of the year. This infection spreads from person to person (is contagious) through coughing, sneezing, or having close  contact. Your health care provider may use other names to describe the infection. When strep throat affects the tonsils, it is called tonsillitis. When it affects the back of the throat, it is called pharyngitis. What are the causes? This condition is caused by the Streptococcus pyogenes bacteria. What increases the risk? You are more likely to develop this condition if: You care for school-age children, or are around school-age children. Children are more likely to get strep throat and may spread it to others. You spend time in crowded places where the infection can spread easily. You have close contact with someone who has strep throat. What are the signs or symptoms? Symptoms of this condition include: Fever or chills. Redness, swelling, or pain in the tonsils or throat. Pain or difficulty when swallowing. White or yellow spots on the tonsils or throat. Tender glands in the neck and under the jaw. Bad smelling breath. Red rash all over the body. This is rare. How is this diagnosed? This condition is diagnosed by tests that check for the presence and the amount of bacteria that cause strep throat. They are: Rapid strep test. Your throat is swabbed and checked for the presence of bacteria. Results are usually ready in minutes. Throat culture test. Your throat is swabbed. The sample is placed in a cup that allows infections to grow. Results are usually ready in 1 or 2 days. How is this treated? This condition may be treated with: Medicines that kill germs (antibiotics). Medicines that relieve pain or fever. These include: Ibuprofen  or acetaminophen . Aspirin, only for  people who are over the age of 9. Throat lozenges. Throat sprays. Follow these instructions at home: Medicines  Take over-the-counter and prescription medicines only as told by your health care provider. Take your antibiotic medicine as told by your health care provider. Do not stop taking the antibiotic even if you  start to feel better. Eating and drinking  If you have trouble swallowing, try eating soft foods until your sore throat feels better. Drink enough fluid to keep your urine pale yellow. To help relieve pain, you may have: Warm fluids, such as soup and tea. Cold fluids, such as frozen desserts or popsicles. General instructions Gargle with a salt-water mixture 3-4 times a day or as needed. To make a salt-water mixture, completely dissolve -1 tsp (3-6 g) of salt in 1 cup (237 mL) of warm water. Get plenty of rest. Stay home from work or school until you have been taking antibiotics for 24 hours. Do not use any products that contain nicotine or tobacco. These products include cigarettes, chewing tobacco, and vaping devices, such as e-cigarettes. If you need help quitting, ask your health care provider. It is up to you to get your test results. Ask your health care provider, or the department that is doing the test, when your results will be ready. Keep all follow-up visits. This is important. How is this prevented?  Do not share food, drinking cups, or personal items that could cause the infection to spread to other people. Wash your hands often with soap and water for at least 20 seconds. If soap and water are not available, use hand sanitizer. Make sure that all people in your house wash their hands well. Have family members tested if they have a sore throat or fever. They may need an antibiotic if they have strep throat. Contact a health care provider if: You have swelling in your neck that keeps getting bigger. You develop a rash, cough, or earache. You cough up a thick mucus that is green, yellow-brown, or bloody. You have pain or discomfort that does not get better with medicine. Your symptoms seem to be getting worse. You have a fever. Get help right away if: You have new symptoms, such as vomiting, severe headache, stiff or painful neck, chest pain, or shortness of breath. You have  severe throat pain, drooling, or changes in your voice. You have swelling of the neck, or the skin on the neck becomes red and tender. You have signs of dehydration, such as tiredness (fatigue), dry mouth, and decreased urination. You become increasingly sleepy, or you cannot wake up completely. Your joints become red or painful. These symptoms may represent a serious problem that is an emergency. Do not wait to see if the symptoms will go away. Get medical help right away. Call your local emergency services (911 in the [place] Do not drive yourself to the hospital. Summary Strep throat is an infection in the throat that is caused by the Streptococcus pyogenes bacteria. This infection is spread from person to person (is contagious) through coughing, sneezing, or having close contact. Take your medicines, including antibiotics, as told by your health care provider. Do not stop taking the antibiotic even if you start to feel better. To prevent the spread of germs, wash your hands well with soap and water. Have others do the same. Do not share food, drinking cups, or personal items. Get help right away if you have new symptoms, such as vomiting, severe headache, stiff or painful neck, chest pain,  or shortness of breath. This information is not intended to replace advice given to you by your health care provider. Make sure you discuss any questions you have with your health care provider. Document Revised: 05/07/2020 Document Reviewed: 05/07/2020 Elsevier Patient Education  2024 Elsevier Inc.   If you have been instructed to have an in-person evaluation today at a local Urgent Care facility, please use the link below. It will take you to a list of all of our available [facility], including address, phone number and hours of operation. Please do not delay care.  [facility]  If you or a family member do not have a primary care provider, use the link below to schedule a visit  and establish care. When you choose a [facility] primary care physician or advanced practice provider, you gain a long-term partner in health. Find a Primary Care Provider  Learn more about [facility]'s in-office and virtual care options: [facility] - Get Care Now

## 2023-10-12 NOTE — Progress Notes (Signed)
 Virtual Visit Consent   [name], you are scheduled for a virtual visit with a [facility] provider today. Just as with appointments in the office, your consent must be obtained to participate. Your consent will be active for this visit and any virtual visit you may have with one of our providers in the next 365 days. If you have a MyChart account, a copy of this consent can be sent to you electronically.  As this is a virtual visit, video technology does not allow for your provider to perform a traditional examination. This may limit your provider's ability to fully assess your condition. If your provider identifies any concerns that need to be evaluated in person or the need to arrange testing (such as labs, EKG, etc.), we will make arrangements to do so. Although advances in technology are sophisticated, we cannot ensure that it will always work on either your end or our end. If the connection with a video visit is poor, the visit may have to be switched to a telephone visit. With either a video or telephone visit, we are not always able to ensure that we have a secure connection.  By engaging in this virtual visit, you consent to the provision of healthcare and authorize for your insurance to be billed (if applicable) for the services provided during this visit. Depending on your insurance coverage, you may receive a charge related to this service.  I need to obtain your verbal consent now. Are you willing to proceed with your visit today? [place] has provided verbal consent on 10/12/2023 for a virtual visit (video or telephone). Daniel Fischer, [place]  Date: 10/12/2023 10:59 AM   Virtual Visit via Video Note   I, Daniel Fischer, connected with  Donne Baley  ([id number], 01-06-1996) on 10/12/23 at 10:45 AM EDT by a video-enabled telemedicine application and verified that I am speaking with the correct person using two identifiers.  Location: Patient: Virtual Visit Location  Patient: Home Provider: Virtual Visit Location Provider: Home Office   I discussed the limitations of evaluation and management by telemedicine and the availability of in person appointments. The patient expressed understanding and agreed to proceed.    History of Present Illness: Daniel Fischer is a 28 y.o. who identifies as a male who was assigned male at birth, and is being seen today for sore throat and swollen tonsils starting Friday of last week with mild symptoms and fatigue. Was initially accompanied with slight nasal congestion. Thought was a mild cold so started OTC medications with some improvement. Nasal symptoms resolved but as of Sunday, throat pain and swelling substantially increasing with noted tonsillar exudate. Denies fever today but has been taking Dayquil/Nyquil. Is noting chills.   HPI: HPI  Problems: There are no active problems to display for this patient.   Allergies: No Known Allergies Medications: No current outpatient medications on file.  Observations/Objective: Patient is well-developed, well-nourished in no acute distress.  Resting comfortably at home.  Head is normocephalic, atraumatic.  No labored breathing. Speech is clear and coherent with logical content.  Patient is alert and oriented at baseline.   Assessment and Plan: 1. Exudative tonsillitis (Primary)  Rx Amoxicillin .  Increase fluids.  Rest.  Saline nasal spray.  Probiotic.  Mucinex as directed.  Humidifier in bedroom. Tylenol  and Ibuprofen  OTC.  In-person evaluation for any non-resolving, new or worsening symptoms despite treatment.   Follow Up Instructions: I discussed the assessment and treatment plan with the patient. The patient was  provided an opportunity to ask questions and all were answered. The patient agreed with the plan and demonstrated an understanding of the instructions.  A copy of instructions were sent to the patient via MyChart unless otherwise noted below.   The patient was  advised to call back or seek an in-person evaluation if the symptoms worsen or if the condition fails to improve as anticipated.    Daniel Velma Lunger, PA-C
